# Patient Record
Sex: Male | Born: 1935 | Race: White | Hispanic: No | Smoking: Never smoker
Health system: Southern US, Community
[De-identification: ages and names within clinical notes are randomized; demographics above are authoritative.]

## PROBLEM LIST (undated history)

## (undated) DIAGNOSIS — S32401A Unspecified fracture of right acetabulum, initial encounter for closed fracture: Secondary | ICD-10-CM

## (undated) DIAGNOSIS — I4891 Unspecified atrial fibrillation: Secondary | ICD-10-CM

## (undated) DIAGNOSIS — S2249XA Multiple fractures of ribs, unspecified side, initial encounter for closed fracture: Secondary | ICD-10-CM

## (undated) DIAGNOSIS — S42001A Fracture of unspecified part of right clavicle, initial encounter for closed fracture: Secondary | ICD-10-CM

## (undated) DIAGNOSIS — I6521 Occlusion and stenosis of right carotid artery: Secondary | ICD-10-CM

## (undated) DIAGNOSIS — S32591A Other specified fracture of right pubis, initial encounter for closed fracture: Secondary | ICD-10-CM

## (undated) DIAGNOSIS — J90 Pleural effusion, not elsewhere classified: Secondary | ICD-10-CM

## (undated) DIAGNOSIS — S12100A Unspecified displaced fracture of second cervical vertebra, initial encounter for closed fracture: Principal | ICD-10-CM

## (undated) DIAGNOSIS — I2510 Atherosclerotic heart disease of native coronary artery without angina pectoris: Secondary | ICD-10-CM

## (undated) DIAGNOSIS — D649 Anemia, unspecified: Secondary | ICD-10-CM

## (undated) DIAGNOSIS — I10 Essential (primary) hypertension: Secondary | ICD-10-CM

## (undated) HISTORY — DX: Fracture of unspecified part of right clavicle, initial encounter for closed fracture: S42.001A

## (undated) HISTORY — DX: Atherosclerotic heart disease of native coronary artery without angina pectoris: I25.10

## (undated) HISTORY — DX: Other specified fracture of right pubis, initial encounter for closed fracture: S32.591A

## (undated) HISTORY — PX: CORONARY ARTERY BYPASS GRAFT: SHX141

## (undated) HISTORY — DX: Essential (primary) hypertension: I10

## (undated) HISTORY — DX: Pleural effusion, not elsewhere classified: J90

## (undated) HISTORY — DX: Unspecified displaced fracture of second cervical vertebra, initial encounter for closed fracture: S12.100A

## (undated) HISTORY — DX: Unspecified atrial fibrillation: I48.91

## (undated) HISTORY — DX: Unspecified fracture of right acetabulum, initial encounter for closed fracture: S32.401A

## (undated) HISTORY — DX: Occlusion and stenosis of right carotid artery: I65.21

## (undated) HISTORY — DX: Multiple fractures of ribs, unspecified side, initial encounter for closed fracture: S22.49XA

## (undated) HISTORY — DX: Anemia, unspecified: D64.9

---

## 2013-08-23 DIAGNOSIS — S32401A Unspecified fracture of right acetabulum, initial encounter for closed fracture: Secondary | ICD-10-CM

## 2013-08-23 DIAGNOSIS — S2249XA Multiple fractures of ribs, unspecified side, initial encounter for closed fracture: Secondary | ICD-10-CM

## 2013-08-23 DIAGNOSIS — J90 Pleural effusion, not elsewhere classified: Secondary | ICD-10-CM

## 2013-08-23 DIAGNOSIS — S32591A Other specified fracture of right pubis, initial encounter for closed fracture: Secondary | ICD-10-CM

## 2013-08-23 DIAGNOSIS — S42001A Fracture of unspecified part of right clavicle, initial encounter for closed fracture: Secondary | ICD-10-CM

## 2013-08-23 HISTORY — DX: Other specified fracture of right pubis, initial encounter for closed fracture: S32.591A

## 2013-08-23 HISTORY — DX: Fracture of unspecified part of right clavicle, initial encounter for closed fracture: S42.001A

## 2013-08-23 HISTORY — DX: Unspecified fracture of right acetabulum, initial encounter for closed fracture: S32.401A

## 2013-08-23 HISTORY — DX: Rider (driver) (passenger) of other motorcycle injured in unspecified traffic accident, initial encounter: V29.99XA

## 2013-08-23 HISTORY — DX: Multiple fractures of ribs, unspecified side, initial encounter for closed fracture: S22.49XA

## 2013-08-23 HISTORY — DX: Pleural effusion, not elsewhere classified: J90

## 2013-09-20 DIAGNOSIS — S12100A Unspecified displaced fracture of second cervical vertebra, initial encounter for closed fracture: Secondary | ICD-10-CM

## 2013-09-20 HISTORY — DX: Unspecified displaced fracture of second cervical vertebra, initial encounter for closed fracture: S12.100A

## 2013-10-09 ENCOUNTER — Non-Acute Institutional Stay (SKILLED_NURSING_FACILITY): Payer: Medicare Other | Admitting: Internal Medicine

## 2013-10-09 DIAGNOSIS — S42009A Fracture of unspecified part of unspecified clavicle, initial encounter for closed fracture: Secondary | ICD-10-CM

## 2013-10-09 DIAGNOSIS — S42001A Fracture of unspecified part of right clavicle, initial encounter for closed fracture: Secondary | ICD-10-CM

## 2013-10-09 DIAGNOSIS — S12100A Unspecified displaced fracture of second cervical vertebra, initial encounter for closed fracture: Secondary | ICD-10-CM

## 2013-10-09 DIAGNOSIS — I4891 Unspecified atrial fibrillation: Secondary | ICD-10-CM

## 2013-10-09 DIAGNOSIS — I10 Essential (primary) hypertension: Secondary | ICD-10-CM

## 2013-10-09 DIAGNOSIS — D649 Anemia, unspecified: Secondary | ICD-10-CM

## 2013-10-10 ENCOUNTER — Encounter: Payer: Self-pay | Admitting: Internal Medicine

## 2013-10-10 DIAGNOSIS — I4891 Unspecified atrial fibrillation: Secondary | ICD-10-CM | POA: Insufficient documentation

## 2013-10-10 DIAGNOSIS — S12100A Unspecified displaced fracture of second cervical vertebra, initial encounter for closed fracture: Secondary | ICD-10-CM | POA: Insufficient documentation

## 2013-10-10 DIAGNOSIS — I10 Essential (primary) hypertension: Secondary | ICD-10-CM | POA: Insufficient documentation

## 2013-10-10 DIAGNOSIS — D649 Anemia, unspecified: Secondary | ICD-10-CM | POA: Insufficient documentation

## 2013-10-10 DIAGNOSIS — S42001A Fracture of unspecified part of right clavicle, initial encounter for closed fracture: Secondary | ICD-10-CM | POA: Insufficient documentation
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
## 2013-10-15 ENCOUNTER — Other Ambulatory Visit: Payer: Self-pay | Admitting: *Deleted

## 2013-10-15 MED ORDER — OXYCODONE-ACETAMINOPHEN 5-325 MG PO TABS: ORAL_TABLET | ORAL | Status: DC

## 2013-10-15 NOTE — Telephone Encounter (Signed)
[facility] 

## 2013-10-17 ENCOUNTER — Emergency Department (HOSPITAL_COMMUNITY): Payer: Medicare Other

## 2013-10-17 ENCOUNTER — Encounter (HOSPITAL_COMMUNITY): Payer: Self-pay | Admitting: Emergency Medicine

## 2013-10-17 ENCOUNTER — Emergency Department (HOSPITAL_COMMUNITY)
Admission: EM | Admit: 2013-10-17 | Discharge: 2013-10-17 | Disposition: A | Discharge status: Home / Self Care | Payer: Medicare Other | Attending: Emergency Medicine | Admitting: Emergency Medicine

## 2013-10-17 DIAGNOSIS — I10 Essential (primary) hypertension: Secondary | ICD-10-CM | POA: Insufficient documentation

## 2013-10-17 DIAGNOSIS — Z8781 Personal history of (healed) traumatic fracture: Secondary | ICD-10-CM | POA: Insufficient documentation

## 2013-10-17 DIAGNOSIS — Z951 Presence of aortocoronary bypass graft: Secondary | ICD-10-CM | POA: Insufficient documentation

## 2013-10-17 DIAGNOSIS — I2510 Atherosclerotic heart disease of native coronary artery without angina pectoris: Secondary | ICD-10-CM | POA: Insufficient documentation

## 2013-10-17 DIAGNOSIS — S0101XA Laceration without foreign body of scalp, initial encounter: Secondary | ICD-10-CM

## 2013-10-17 DIAGNOSIS — W010XXA Fall on same level from slipping, tripping and stumbling without subsequent striking against object, initial encounter: Secondary | ICD-10-CM | POA: Insufficient documentation

## 2013-10-17 DIAGNOSIS — W1809XA Striking against other object with subsequent fall, initial encounter: Secondary | ICD-10-CM | POA: Insufficient documentation

## 2013-10-17 DIAGNOSIS — S0990XA Unspecified injury of head, initial encounter: Secondary | ICD-10-CM

## 2013-10-17 DIAGNOSIS — D689 Coagulation defect, unspecified: Secondary | ICD-10-CM | POA: Insufficient documentation

## 2013-10-17 DIAGNOSIS — S0100XA Unspecified open wound of scalp, initial encounter: Secondary | ICD-10-CM | POA: Insufficient documentation

## 2013-10-17 DIAGNOSIS — J90 Pleural effusion, not elsewhere classified: Secondary | ICD-10-CM

## 2013-10-17 DIAGNOSIS — I4891 Unspecified atrial fibrillation: Secondary | ICD-10-CM | POA: Insufficient documentation

## 2013-10-17 DIAGNOSIS — Z7901 Long term (current) use of anticoagulants: Secondary | ICD-10-CM | POA: Insufficient documentation

## 2013-10-17 DIAGNOSIS — Z862 Personal history of diseases of the blood and blood-forming organs and certain disorders involving the immune mechanism: Secondary | ICD-10-CM | POA: Insufficient documentation

## 2013-10-17 DIAGNOSIS — Y9389 Activity, other specified: Secondary | ICD-10-CM | POA: Insufficient documentation

## 2013-10-17 DIAGNOSIS — W19XXXA Unspecified fall, initial encounter: Secondary | ICD-10-CM

## 2013-10-17 DIAGNOSIS — Y9289 Other specified places as the place of occurrence of the external cause: Secondary | ICD-10-CM | POA: Insufficient documentation

## 2013-10-17 DIAGNOSIS — Z79899 Other long term (current) drug therapy: Secondary | ICD-10-CM | POA: Insufficient documentation

## 2013-10-17 LAB — CBC
HEMATOCRIT: 31.3 % — AB (ref 39.0–52.0)
Hemoglobin: 10 g/dL — ABNORMAL LOW (ref 13.0–17.0)
MCH: 27.4 pg (ref 26.0–34.0)
MCHC: 31.9 g/dL (ref 30.0–36.0)
MCV: 85.8 fL (ref 78.0–100.0)
PLATELETS: 318 10*3/uL (ref 150–400)
RBC: 3.65 MIL/uL — AB (ref 4.22–5.81)
RDW: 15.6 % — ABNORMAL HIGH (ref 11.5–15.5)
WBC: 7.8 10*3/uL (ref 4.0–10.5)

## 2013-10-17 LAB — BASIC METABOLIC PANEL
BUN: 10 mg/dL (ref 6–23)
CALCIUM: 8.2 mg/dL — AB (ref 8.4–10.5)
CHLORIDE: 100 meq/L (ref 96–112)
CO2: 28 meq/L (ref 19–32)
CREATININE: 0.71 mg/dL (ref 0.50–1.35)
GFR calc Af Amer: >90 mL/min (ref >90)
GFR calc non Af Amer: 89 mL/min — ABNORMAL LOW (ref >90)
GLUCOSE: 121 mg/dL — AB (ref 70–99)
Potassium: 3.7 mEq/L (ref 3.7–5.3)
Sodium: 139 mEq/L (ref 137–147)

## 2013-10-17 LAB — PROTIME-INR
INR: 2.24 — ABNORMAL HIGH (ref 0.00–1.49)
Prothrombin Time: 24.1 seconds — ABNORMAL HIGH (ref 11.6–15.2)

## 2013-10-17 NOTE — Discharge Instructions (Signed)
If you were given medicines take as directed.  If you are on coumadin or contraceptives realize their levels and effectiveness is altered by many different medicines.  If you have any reaction (rash, tongues swelling, other) to the medicines stop taking and see a physician.   You can rarely have delayed head bleeding on coumadin so make sure someone is around you the next 24 hrs. Please follow up as directed and return to the ER or see a physician for new or worsening symptoms (vomiting, confusion, weakness, head injury, other).  Thank you. Filed Vitals:   10/17/13 0107 10/17/13 0109 10/17/13 0115 10/17/13 0130  BP: 140/74  131/80 121/76  Pulse: 80  83 88  Temp: 98.3 F (36.8 C)     TempSrc: Oral     Resp: 22  22 22   SpO2: 97% 98% 98% 96%
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
## 2013-10-17 NOTE — ED Notes (Signed)
[facility]charge [facility]
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
## 2013-10-20 ENCOUNTER — Non-Acute Institutional Stay (SKILLED_NURSING_FACILITY): Payer: Medicare Other | Admitting: Internal Medicine

## 2013-10-20 DIAGNOSIS — S42009A Fracture of unspecified part of unspecified clavicle, initial encounter for closed fracture: Secondary | ICD-10-CM

## 2013-10-20 DIAGNOSIS — S12100A Unspecified displaced fracture of second cervical vertebra, initial encounter for closed fracture: Secondary | ICD-10-CM

## 2013-10-20 DIAGNOSIS — Y92129 Unspecified place in nursing home as the place of occurrence of the external cause: Principal | ICD-10-CM

## 2013-10-20 DIAGNOSIS — I4891 Unspecified atrial fibrillation: Secondary | ICD-10-CM

## 2013-10-20 DIAGNOSIS — W19XXXA Unspecified fall, initial encounter: Secondary | ICD-10-CM

## 2013-10-20 DIAGNOSIS — D649 Anemia, unspecified: Secondary | ICD-10-CM

## 2013-10-20 DIAGNOSIS — S42001A Fracture of unspecified part of right clavicle, initial encounter for closed fracture: Secondary | ICD-10-CM
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
## 2013-10-28 ENCOUNTER — Non-Acute Institutional Stay (SKILLED_NURSING_FACILITY): Payer: Medicare Other | Admitting: Internal Medicine

## 2013-10-28 DIAGNOSIS — S12100A Unspecified displaced fracture of second cervical vertebra, initial encounter for closed fracture: Secondary | ICD-10-CM

## 2013-10-28 DIAGNOSIS — S42009A Fracture of unspecified part of unspecified clavicle, initial encounter for closed fracture: Secondary | ICD-10-CM

## 2013-10-28 DIAGNOSIS — D649 Anemia, unspecified: Secondary | ICD-10-CM

## 2013-10-28 DIAGNOSIS — I10 Essential (primary) hypertension: Secondary | ICD-10-CM

## 2013-10-28 DIAGNOSIS — S42001A Fracture of unspecified part of right clavicle, initial encounter for closed fracture: Secondary | ICD-10-CM

## 2013-10-28 DIAGNOSIS — I4891 Unspecified atrial fibrillation: Secondary | ICD-10-CM
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
## 2013-11-01 ENCOUNTER — Encounter: Payer: Self-pay | Admitting: Internal Medicine

## 2016-08-30 ENCOUNTER — Emergency Department (HOSPITAL_COMMUNITY): Payer: No Typology Code available for payment source

## 2016-08-30 ENCOUNTER — Encounter (HOSPITAL_COMMUNITY): Payer: Self-pay

## 2016-08-30 ENCOUNTER — Emergency Department (HOSPITAL_COMMUNITY)
Admission: EM | Admit: 2016-08-30 | Discharge: 2016-08-30 | Disposition: A | Discharge status: Home / Self Care | Payer: No Typology Code available for payment source | Attending: Emergency Medicine | Admitting: Emergency Medicine

## 2016-08-30 DIAGNOSIS — R935 Abnormal findings on diagnostic imaging of other abdominal regions, including retroperitoneum: Secondary | ICD-10-CM | POA: Insufficient documentation

## 2016-08-30 DIAGNOSIS — Y999 Unspecified external cause status: Secondary | ICD-10-CM | POA: Diagnosis not present

## 2016-08-30 DIAGNOSIS — R918 Other nonspecific abnormal finding of lung field: Secondary | ICD-10-CM | POA: Diagnosis not present

## 2016-08-30 DIAGNOSIS — Y939 Activity, unspecified: Secondary | ICD-10-CM | POA: Diagnosis not present

## 2016-08-30 DIAGNOSIS — S0990XA Unspecified injury of head, initial encounter: Secondary | ICD-10-CM | POA: Diagnosis present

## 2016-08-30 DIAGNOSIS — I7774 Dissection of vertebral artery: Secondary | ICD-10-CM | POA: Diagnosis not present

## 2016-08-30 DIAGNOSIS — S0001XA Abrasion of scalp, initial encounter: Secondary | ICD-10-CM | POA: Insufficient documentation

## 2016-08-30 DIAGNOSIS — Z951 Presence of aortocoronary bypass graft: Secondary | ICD-10-CM | POA: Diagnosis not present

## 2016-08-30 DIAGNOSIS — S60512A Abrasion of left hand, initial encounter: Secondary | ICD-10-CM | POA: Insufficient documentation

## 2016-08-30 DIAGNOSIS — S12120A Other displaced dens fracture, initial encounter for closed fracture: Secondary | ICD-10-CM | POA: Diagnosis not present

## 2016-08-30 DIAGNOSIS — S12101A Unspecified nondisplaced fracture of second cervical vertebra, initial encounter for closed fracture: Secondary | ICD-10-CM

## 2016-08-30 DIAGNOSIS — Y92410 Unspecified street and highway as the place of occurrence of the external cause: Secondary | ICD-10-CM | POA: Diagnosis not present

## 2016-08-30 DIAGNOSIS — Z7901 Long term (current) use of anticoagulants: Secondary | ICD-10-CM | POA: Diagnosis not present

## 2016-08-30 DIAGNOSIS — I2510 Atherosclerotic heart disease of native coronary artery without angina pectoris: Secondary | ICD-10-CM | POA: Insufficient documentation

## 2016-08-30 LAB — CBC WITH DIFFERENTIAL/PLATELET
Basophils Absolute: 0 10*3/uL (ref 0.0–0.1)
Basophils Relative: 0 %
Eosinophils Absolute: 0 10*3/uL (ref 0.0–0.7)
Eosinophils Relative: 0 %
HCT: 42.2 % (ref 39.0–52.0)
HEMOGLOBIN: 14.3 g/dL (ref 13.0–17.0)
LYMPHS ABS: 1.2 10*3/uL (ref 0.7–4.0)
Lymphocytes Relative: 8 %
MCH: 32.1 pg (ref 26.0–34.0)
MCHC: 33.9 g/dL (ref 30.0–36.0)
MCV: 94.8 fL (ref 78.0–100.0)
MONOS PCT: 8 %
Monocytes Absolute: 1.1 10*3/uL — ABNORMAL HIGH (ref 0.1–1.0)
NEUTROS PCT: 84 %
Neutro Abs: 12.7 10*3/uL — ABNORMAL HIGH (ref 1.7–7.7)
Platelets: 219 10*3/uL (ref 150–400)
RBC: 4.45 MIL/uL (ref 4.22–5.81)
RDW: 12.8 % (ref 11.5–15.5)
WBC: 15 10*3/uL — ABNORMAL HIGH (ref 4.0–10.5)

## 2016-08-30 LAB — COMPREHENSIVE METABOLIC PANEL
ALK PHOS: 50 U/L (ref 38–126)
ALT: 24 U/L (ref 17–63)
ANION GAP: 14 (ref 5–15)
AST: 37 U/L (ref 15–41)
Albumin: 3.8 g/dL (ref 3.5–5.0)
BUN: 10 mg/dL (ref 6–20)
CO2: 23 mmol/L (ref 22–32)
CREATININE: 0.9 mg/dL (ref 0.61–1.24)
Calcium: 9 mg/dL (ref 8.9–10.3)
Chloride: 103 mmol/L (ref 101–111)
GFR calc non Af Amer: >60 mL/min (ref >60)
Glucose, Bld: 106 mg/dL — ABNORMAL HIGH (ref 65–99)
Potassium: 4.1 mmol/L (ref 3.5–5.1)
Sodium: 140 mmol/L (ref 135–145)
Total Bilirubin: 0.9 mg/dL (ref 0.3–1.2)
Total Protein: 6.8 g/dL (ref 6.5–8.1)

## 2016-08-30 LAB — PROTIME-INR
INR: 2.27
Prothrombin Time: 25.4 seconds — ABNORMAL HIGH (ref 11.4–15.2)

## 2016-08-30 MED ORDER — IOPAMIDOL (ISOVUE-300) INJECTION 61%
INTRAVENOUS | Status: AC
Start: 2016-08-30 — End: 2016-08-30
  Administered 2016-08-30: 100 mL
  Filled 2016-08-30: qty 100

## 2016-08-30 MED ORDER — ASPIRIN 81 MG PO CHEW: 81.0000 mg | CHEWABLE_TABLET | Freq: Every day | ORAL | 0 refills | Status: AC

## 2016-08-30 MED ORDER — IOPAMIDOL (ISOVUE-370) INJECTION 76%
50.0000 mL | Freq: Once | INTRAVENOUS | Status: AC | PRN
  Administered 2016-08-30: 50 mL via INTRAVENOUS
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
## 2016-08-30 NOTE — Discharge Instructions (Signed)
Follow up in the office for reeval.  C collar on at all times.
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
## 2016-08-30 NOTE — ED Notes (Signed)
[employer] belonging sent home with patient

## 2016-08-30 NOTE — ED Notes (Signed)
Patient transported to CT 

## 2016-09-11 ENCOUNTER — Other Ambulatory Visit: Payer: Self-pay | Admitting: Neurosurgery

## 2016-09-11 DIAGNOSIS — S12191A Other nondisplaced fracture of second cervical vertebra, initial encounter for closed fracture: Secondary | ICD-10-CM

## 2016-10-16 ENCOUNTER — Ambulatory Visit
Admission: RE | Admit: 2016-10-16 | Discharge: 2016-10-16 | Disposition: A | Discharge status: Home / Self Care | Payer: Medicare Other | Source: Ambulatory Visit | Attending: Neurosurgery | Admitting: Neurosurgery

## 2016-10-16 DIAGNOSIS — S12191A Other nondisplaced fracture of second cervical vertebra, initial encounter for closed fracture: Secondary | ICD-10-CM

## 2016-10-22 ENCOUNTER — Other Ambulatory Visit: Payer: Self-pay | Admitting: Neurosurgery

## 2016-10-22 DIAGNOSIS — S12191A Other nondisplaced fracture of second cervical vertebra, initial encounter for closed fracture: Secondary | ICD-10-CM

## 2016-11-13 ENCOUNTER — Ambulatory Visit
Admission: RE | Admit: 2016-11-13 | Discharge: 2016-11-13 | Disposition: A | Discharge status: Home / Self Care | Payer: Medicare Other | Source: Ambulatory Visit | Attending: Neurosurgery | Admitting: Neurosurgery

## 2016-11-13 DIAGNOSIS — S12191A Other nondisplaced fracture of second cervical vertebra, initial encounter for closed fracture: Secondary | ICD-10-CM

## 2018-08-12 IMAGING — CT CT CERVICAL SPINE W/O CM
2 series · 10 of 14 positions shown, 12 images · non-contrast
Comparison: 08/30/2016

CLINICAL DATA: Right lateral mass fractures C2. Vertebral
dissection.

EXAM:
CT CERVICAL SPINE WITHOUT CONTRAST
TECHNIQUE: Multidetector CT imaging of the cervical spine was performed without
intravenous contrast. Multiplanar CT image reconstructions were also
generated.

[Series 2: cspine soft · axial · 0.23mm/px · z∈[-219,-83]mm · 5 of 104 slices shown]
[im 18/104  soft-tissue]
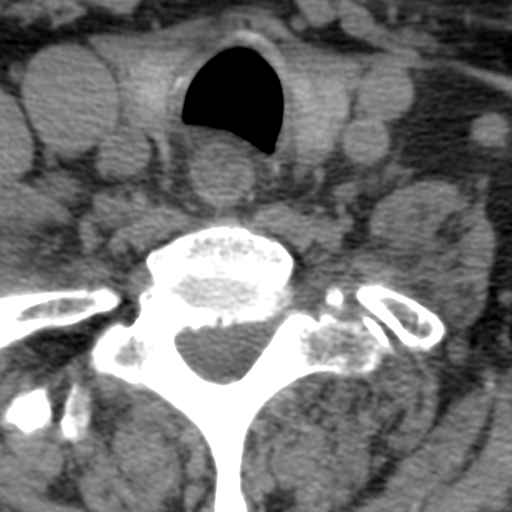
[im 35/104  soft-tissue]
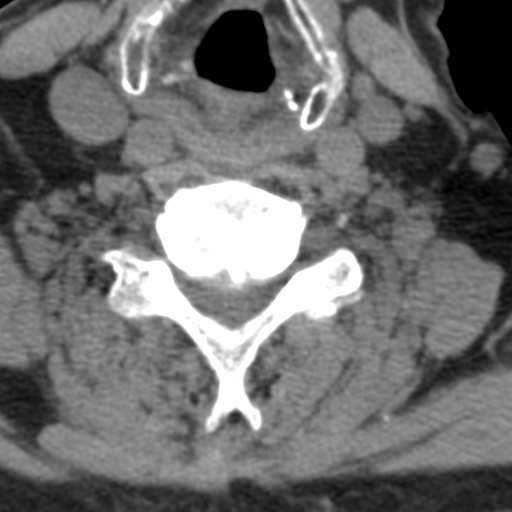
[im 52/104  soft-tissue]
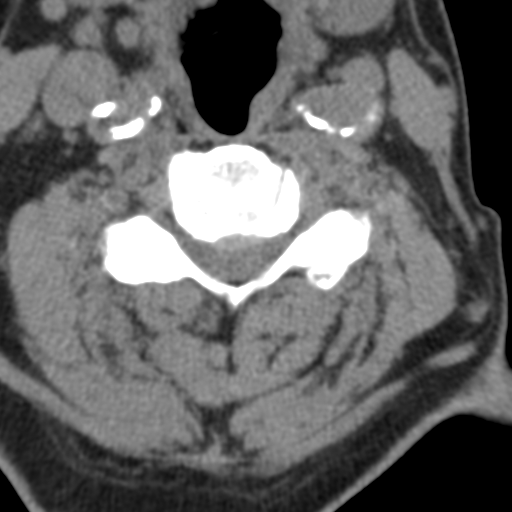
[im 69/104  soft-tissue]
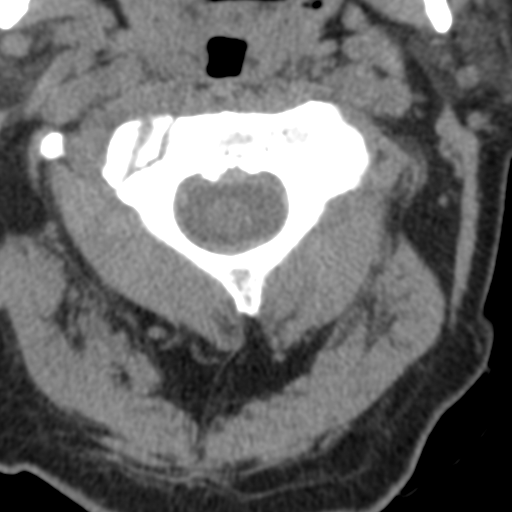
[im 86/104  soft-tissue]
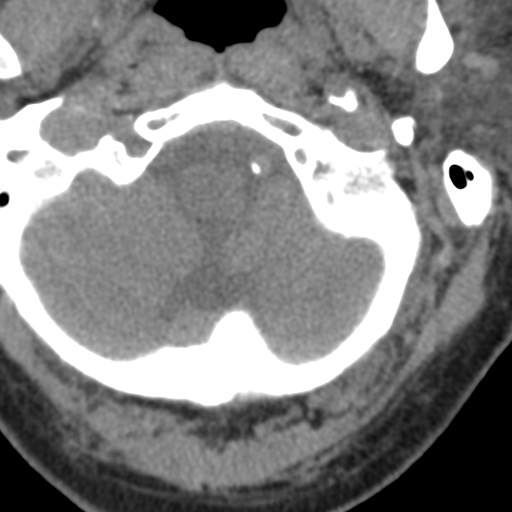

[Series 8: angled axial · axial · 0.23mm/px · z∈[-229,-98]mm · 5 of 103 slices shown, 7 images]
[im 18/103  soft-tissue]
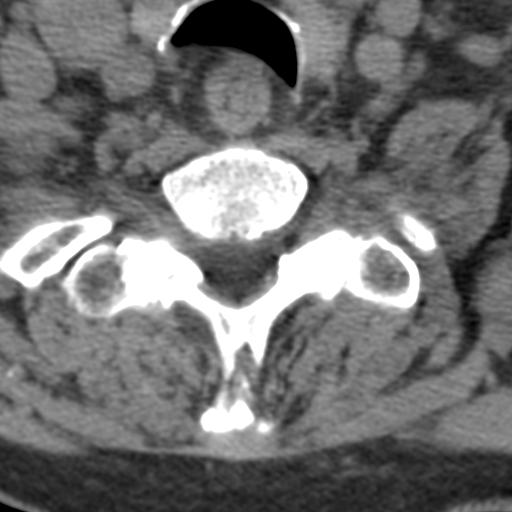
[im 18/103  bone]
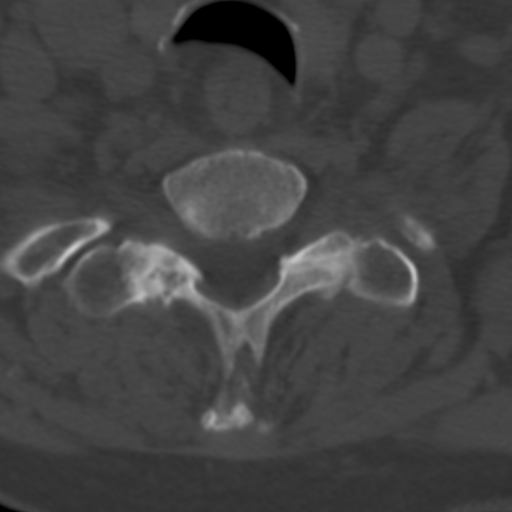
[im 35/103  bone]
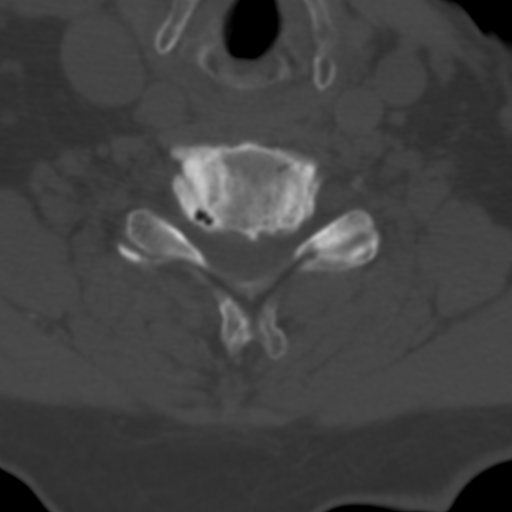
[im 52/103  bone]
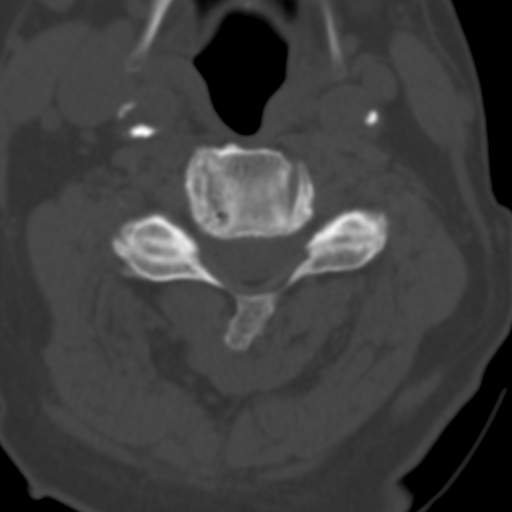
[im 69/103  bone]
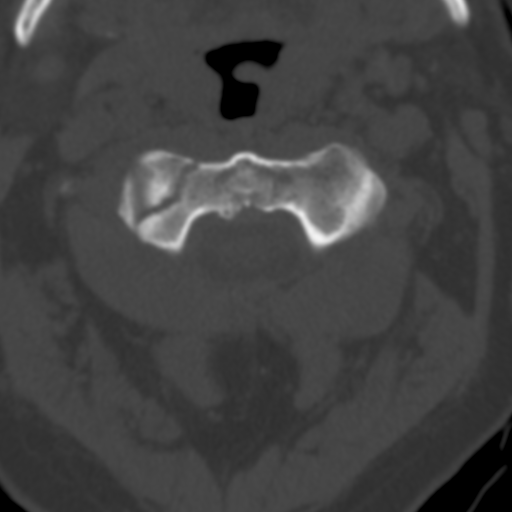
[im 86/103  soft-tissue]
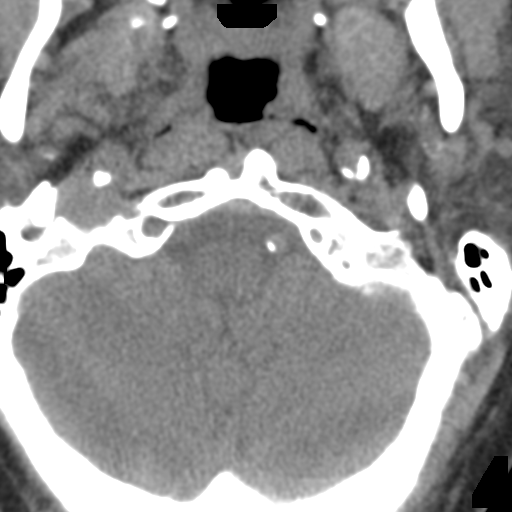
[im 86/103  bone]
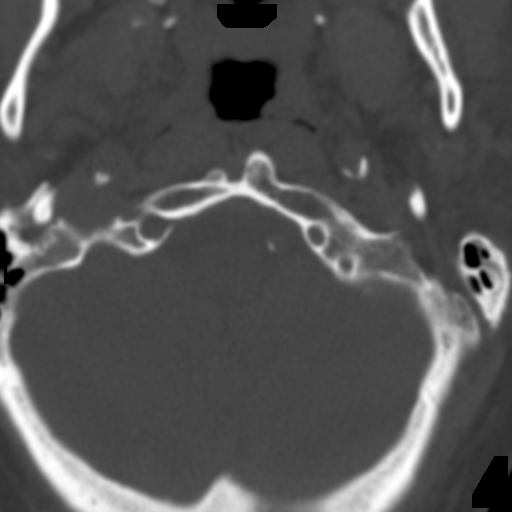

[10 of 14 positions shown; findings below may reference images not displayed]

FINDINGS: Alignment: No change since the previous study. No traumatic
malalignment.

Skull base and vertebrae: No change since the previous study.
Nondisplaced/minimally displaced right lateral mass fracture of C2
appears the same. One could question minimal indistinctness of the
fracture line indicating the earliest changes of healing. As noted
previously, fracture line does involve the transverse foramen on the
right. Status of the right vertebral artery cannot be evaluated on
this standard spine CT.

Soft tissues and spinal canal: No significant finding.

Disc levels: As seen previously, there is osteoarthritis of the C1-2
articulation. Facet arthropathy is present on the right at C7-T1 and
on the left at C7-T1. Degenerative spondylosis is present throughout
the region with endplate osteophytes. Mild foraminal encroachment by
osteophytes at C3-4, C4-5, C5-6 and C6-7. C6-7 level is probably
functionally fused.

Upper chest: Negative

Other: Negative
IMPRESSION: No change in position or alignment of the minimally/ nondisplaced
right lateral mass fracture of C2. One could question the earliest
changes of indistinctness/ healing. Cannot assess right vertebral
artery without contrast.
